# Patient Record
Sex: Male | Born: 1965 | Race: Black or African American | Hispanic: No | Marital: Married | Smoking: Never smoker
Health system: Southern US, Community
[De-identification: ages and names within clinical notes are randomized; demographics above are authoritative.]

---

## 1999-09-18 ENCOUNTER — Emergency Department (HOSPITAL_COMMUNITY): Admission: EM | Admit: 1999-09-18 | Discharge: 1999-09-18 | Payer: Self-pay | Admitting: Emergency Medicine

## 2001-05-13 ENCOUNTER — Encounter: Payer: Self-pay | Admitting: Emergency Medicine

## 2001-05-13 ENCOUNTER — Emergency Department (HOSPITAL_COMMUNITY): Admission: EM | Admit: 2001-05-13 | Discharge: 2001-05-13 | Payer: Self-pay | Admitting: Emergency Medicine

## 2001-05-15 ENCOUNTER — Encounter: Payer: Self-pay | Admitting: Family Medicine

## 2001-05-15 ENCOUNTER — Encounter: Admission: RE | Admit: 2001-05-15 | Discharge: 2001-05-15 | Payer: Self-pay | Admitting: Family Medicine

## 2001-10-13 ENCOUNTER — Emergency Department (HOSPITAL_COMMUNITY): Admission: EM | Admit: 2001-10-13 | Discharge: 2001-10-13 | Payer: Self-pay | Admitting: Emergency Medicine

## 2001-10-13 ENCOUNTER — Encounter: Payer: Self-pay | Admitting: Emergency Medicine

## 2003-11-15 ENCOUNTER — Emergency Department (HOSPITAL_COMMUNITY): Admission: EM | Admit: 2003-11-15 | Discharge: 2003-11-15 | Payer: Self-pay | Admitting: Emergency Medicine

## 2003-11-23 ENCOUNTER — Encounter: Admission: RE | Admit: 2003-11-23 | Discharge: 2003-11-23 | Payer: Self-pay | Admitting: Orthopedic Surgery

## 2003-12-09 ENCOUNTER — Encounter: Admission: RE | Admit: 2003-12-09 | Discharge: 2004-03-08 | Payer: Self-pay | Admitting: Orthopedic Surgery

## 2008-05-28 ENCOUNTER — Emergency Department (HOSPITAL_COMMUNITY): Admission: EM | Admit: 2008-05-28 | Discharge: 2008-05-28 | Payer: Self-pay | Admitting: Emergency Medicine

## 2011-03-17 ENCOUNTER — Emergency Department (HOSPITAL_COMMUNITY): Payer: No Typology Code available for payment source

## 2011-03-17 ENCOUNTER — Emergency Department (HOSPITAL_COMMUNITY)
Admission: EM | Admit: 2011-03-17 | Discharge: 2011-03-17 | Disposition: A | Discharge status: Home / Self Care | Payer: No Typology Code available for payment source | Attending: Emergency Medicine | Admitting: Emergency Medicine

## 2011-03-17 DIAGNOSIS — M25529 Pain in unspecified elbow: Secondary | ICD-10-CM | POA: Insufficient documentation

## 2011-03-17 DIAGNOSIS — M542 Cervicalgia: Secondary | ICD-10-CM | POA: Insufficient documentation

## 2011-03-17 DIAGNOSIS — S139XXA Sprain of joints and ligaments of unspecified parts of neck, initial encounter: Secondary | ICD-10-CM | POA: Insufficient documentation

## 2011-05-09 LAB — GLUCOSE, CAPILLARY: Glucose-Capillary: 98

## 2012-06-22 ENCOUNTER — Encounter (HOSPITAL_COMMUNITY): Payer: Self-pay | Admitting: Emergency Medicine

## 2012-06-22 ENCOUNTER — Emergency Department (HOSPITAL_COMMUNITY)
Admission: EM | Admit: 2012-06-22 | Discharge: 2012-06-22 | Disposition: A | Discharge status: Home / Self Care | Payer: BC Managed Care – PPO | Attending: Emergency Medicine | Admitting: Emergency Medicine

## 2012-06-22 DIAGNOSIS — K2970 Gastritis, unspecified, without bleeding: Secondary | ICD-10-CM | POA: Insufficient documentation

## 2012-06-22 LAB — URINALYSIS, MICROSCOPIC ONLY
Leukocytes, UA: NEGATIVE
Protein, ur: 30 mg/dL — AB
Urobilinogen, UA: 0.2 mg/dL (ref 0.0–1.0)

## 2012-06-22 LAB — CBC WITH DIFFERENTIAL/PLATELET
Eosinophils Relative: 2 % (ref 0–5)
HCT: 40.8 % (ref 39.0–52.0)
Lymphocytes Relative: 30 % (ref 12–46)
Lymphs Abs: 2.1 10*3/uL (ref 0.7–4.0)
MCV: 101 fL — ABNORMAL HIGH (ref 78.0–100.0)
Monocytes Absolute: 0.6 10*3/uL (ref 0.1–1.0)
RBC: 4.04 MIL/uL — ABNORMAL LOW (ref 4.22–5.81)
RDW: 12.6 % (ref 11.5–15.5)
WBC: 7.1 10*3/uL (ref 4.0–10.5)

## 2012-06-22 LAB — COMPREHENSIVE METABOLIC PANEL
BUN: 9 mg/dL (ref 6–23)
CO2: 26 mEq/L (ref 19–32)
Calcium: 9.2 mg/dL (ref 8.4–10.5)
Creatinine, Ser: 1.02 mg/dL (ref 0.50–1.35)
GFR calc Af Amer: >90 mL/min (ref >90)
GFR calc non Af Amer: 86 mL/min — ABNORMAL LOW (ref >90)
Glucose, Bld: 82 mg/dL (ref 70–99)

## 2012-06-22 MED ORDER — SUCRALFATE 1 G PO TABS: 1.0000 g | ORAL_TABLET | Freq: Four times a day (QID) | ORAL | Status: DC

## 2012-06-22 MED ORDER — GI COCKTAIL ~~LOC~~
30.0000 mL | Freq: Once | ORAL | Status: AC
  Administered 2012-06-22: 30 mL via ORAL
  Filled 2012-06-22: qty 30

## 2012-06-22 MED ORDER — FAMOTIDINE 20 MG PO TABS
20.0000 mg | ORAL_TABLET | Freq: Once | ORAL | Status: AC
  Administered 2012-06-22: 20 mg via ORAL
  Filled 2012-06-22: qty 1

## 2012-06-22 MED ORDER — OMEPRAZOLE 20 MG PO CPDR: 20.0000 mg | DELAYED_RELEASE_CAPSULE | Freq: Every day | ORAL | Status: DC

## 2012-06-22 MED ORDER — HYDROCODONE-ACETAMINOPHEN 5-325 MG PO TABS: 1.0000 | ORAL_TABLET | ORAL | Status: DC | PRN

## 2012-06-22 MED ORDER — PANTOPRAZOLE SODIUM 40 MG PO TBEC
40.0000 mg | DELAYED_RELEASE_TABLET | Freq: Once | ORAL | Status: AC
  Administered 2012-06-22: 40 mg via ORAL
  Filled 2012-06-22: qty 1

## 2012-06-22 NOTE — ED Notes (Signed)
Notified RN that pt was moved to room from triage

## 2012-06-22 NOTE — ED Provider Notes (Signed)
History     CSN: [id number]  Arrival date & time 06/22/12  1814   First MD Initiated Contact with Patient 06/22/12 2155      Chief Complaint  Patient presents with  . Abdominal Pain    (Consider location/radiation/quality/duration/timing/severity/associated sxs/prior treatment) HPI History provided by pt.   Pt c/o intermittent epigastric pain for the past 2 weeks.  Pain has been constant recently.  Radiates into mid-line chest.  No associated fever, cough, SOB, N/V/D, hematemesis/hematochezia/melena, urinary sx.  No aggravating/alleviating factors, including position, exertion and eating.  Pt drinks alcohol on a daily basis, heavily on weekends.  He is otherwise healthy.  No h/o GERD, gastritis, PUD, pancreatitis.  No h/o abdominal surgeries.  No FH early MI.   History reviewed. No pertinent past medical history.  History reviewed. No pertinent past surgical history.  History reviewed. No pertinent family history.  History  Substance Use Topics  . Smoking status: Never Smoker   . Smokeless tobacco: Not on file  . Alcohol Use: 7.2 oz/week    12 Cans of beer per week      Review of Systems  All other systems reviewed and are negative.    Allergies  Review of patient's allergies indicates no known allergies.  Home Medications   Current Outpatient Rx  Name  Route  Sig  Dispense  Refill  . RANITIDINE HCL 150 MG PO TABS   Oral   Take 150 mg by mouth once.           BP 131/76  Pulse 63  Temp 97.9 F (36.6 C) (Oral)  Resp 16  SpO2 96%  Physical Exam  Nursing note and vitals reviewed. Constitutional: He is oriented to person, place, and time. He appears well-developed and well-nourished. No distress.  HENT:  Head: Normocephalic and atraumatic.  Eyes:       Normal appearance  Neck: Normal range of motion.  Cardiovascular: Normal rate and regular rhythm.   Pulmonary/Chest: Effort normal and breath sounds normal. No respiratory distress.  Abdominal: Soft.  Bowel sounds are normal. He exhibits no distension and no mass. There is no rebound and no guarding.       Epigastric, LUQ and mild LLQ ttp.    Genitourinary:       No CVA tenderness  Musculoskeletal: Normal range of motion.  Neurological: He is alert and oriented to person, place, and time.  Skin: Skin is warm and dry. No rash noted.  Psychiatric: He has a normal mood and affect. His behavior is normal.    ED Course  Procedures (including critical care time)   Date: 06/22/2012  Rate: 59  Rhythm: normal sinus rhythm  QRS Axis: normal  Intervals: normal  ST/T Wave abnormalities: normal  Conduction Disutrbances:none  Narrative Interpretation:   Old EKG Reviewed: none available   Labs Reviewed  CBC WITH DIFFERENTIAL - Abnormal; Notable for the following:    RBC 4.04 (*)     MCV 101.0 (*)     MCH 35.1 (*)     All other components within normal limits  COMPREHENSIVE METABOLIC PANEL - Abnormal; Notable for the following:    Alkaline Phosphatase 37 (*)     GFR calc non Af Amer 86 (*)     All other components within normal limits  URINALYSIS, MICROSCOPIC ONLY - Abnormal; Notable for the following:    Hgb urine dipstick SMALL (*)     Protein, ur 30 (*)     All other components within normal  limits  LIPASE, BLOOD   No results found.   1. Gastritis       MDM  46yo M who drinks alcohol heavily, otherwise healthy, presents w/ c/o epigastric pain.  Based on history and exam and unremarkable labs, suspect gastritis.  Low risk ACS, pain non-exertional, no associated SOB and EKG non-ischemic.  Pt to receive GI cocktail and po pepcid/protonix.  Will reassess shortly.    Pain improved.  Recommended that he cut back on alcohol, prescribed prilosec and carafate, referred to GI for persistent/recurrent sx and Return precautions discussed. 11:30 PM     John Kerr, [place] 06/22/12 2330

## 2012-06-22 NOTE — ED Notes (Signed)
Pt states that he has had generalized abd pain x 2 wks.  Denies NVD.  Pain 8/10.

## 2012-06-22 NOTE — ED Provider Notes (Signed)
Medical screening examination/treatment/procedure(s) were performed by non-physician practitioner and as supervising physician I was immediately available for consultation/collaboration.   Gerhard Munch, MD 06/22/12 [phone]

## 2013-08-29 ENCOUNTER — Encounter: Payer: Self-pay | Admitting: Internal Medicine

## 2013-09-24 ENCOUNTER — Ambulatory Visit: Payer: BC Managed Care – PPO | Admitting: Internal Medicine

## 2013-10-02 ENCOUNTER — Emergency Department (HOSPITAL_COMMUNITY): Payer: BC Managed Care – PPO

## 2013-10-02 ENCOUNTER — Emergency Department (INDEPENDENT_AMBULATORY_CARE_PROVIDER_SITE_OTHER)
Admission: EM | Admit: 2013-10-02 | Discharge: 2013-10-02 | Disposition: A | Discharge status: Other Acute Inpatient Hospital | Payer: BC Managed Care – PPO | Source: Home / Self Care | Attending: Emergency Medicine | Admitting: Emergency Medicine

## 2013-10-02 ENCOUNTER — Emergency Department (HOSPITAL_COMMUNITY)
Admission: EM | Admit: 2013-10-02 | Discharge: 2013-10-02 | Disposition: A | Discharge status: Home / Self Care | Payer: BC Managed Care – PPO | Attending: Emergency Medicine | Admitting: Emergency Medicine

## 2013-10-02 ENCOUNTER — Encounter (HOSPITAL_COMMUNITY): Payer: Self-pay | Admitting: Emergency Medicine

## 2013-10-02 DIAGNOSIS — R109 Unspecified abdominal pain: Secondary | ICD-10-CM

## 2013-10-02 DIAGNOSIS — Z79899 Other long term (current) drug therapy: Secondary | ICD-10-CM | POA: Insufficient documentation

## 2013-10-02 DIAGNOSIS — R1011 Right upper quadrant pain: Secondary | ICD-10-CM | POA: Insufficient documentation

## 2013-10-02 DIAGNOSIS — R1013 Epigastric pain: Secondary | ICD-10-CM | POA: Insufficient documentation

## 2013-10-02 LAB — COMPREHENSIVE METABOLIC PANEL
ALK PHOS: 38 U/L — AB (ref 39–117)
ALT: 14 U/L (ref 0–53)
AST: 15 U/L (ref 0–37)
Albumin: 3.7 g/dL (ref 3.5–5.2)
BUN: 16 mg/dL (ref 6–23)
CO2: 27 meq/L (ref 19–32)
Calcium: 9 mg/dL (ref 8.4–10.5)
Chloride: 105 mEq/L (ref 96–112)
Creatinine, Ser: 0.99 mg/dL (ref 0.50–1.35)
GLUCOSE: 98 mg/dL (ref 70–99)
POTASSIUM: 4.2 meq/L (ref 3.7–5.3)
SODIUM: 142 meq/L (ref 137–147)
Total Bilirubin: 0.3 mg/dL (ref 0.3–1.2)
Total Protein: 6.7 g/dL (ref 6.0–8.3)

## 2013-10-02 LAB — CBC WITH DIFFERENTIAL/PLATELET
Basophils Absolute: 0 10*3/uL (ref 0.0–0.1)
Basophils Relative: 0 % (ref 0–1)
EOS PCT: 2 % (ref 0–5)
Eosinophils Absolute: 0.1 10*3/uL (ref 0.0–0.7)
HCT: 40 % (ref 39.0–52.0)
HEMOGLOBIN: 14.2 g/dL (ref 13.0–17.0)
LYMPHS ABS: 1.9 10*3/uL (ref 0.7–4.0)
LYMPHS PCT: 23 % (ref 12–46)
MCH: 35.9 pg — AB (ref 26.0–34.0)
MCHC: 35.5 g/dL (ref 30.0–36.0)
MCV: 101 fL — AB (ref 78.0–100.0)
MONOS PCT: 7 % (ref 3–12)
Monocytes Absolute: 0.6 10*3/uL (ref 0.1–1.0)
NEUTROS PCT: 68 % (ref 43–77)
Neutro Abs: 5.6 10*3/uL (ref 1.7–7.7)
PLATELETS: 214 10*3/uL (ref 150–400)
RBC: 3.96 MIL/uL — AB (ref 4.22–5.81)
RDW: 12.9 % (ref 11.5–15.5)
WBC: 8.3 10*3/uL (ref 4.0–10.5)

## 2013-10-02 LAB — LIPASE, BLOOD: LIPASE: 18 U/L (ref 11–59)

## 2013-10-02 MED ORDER — HYDROMORPHONE HCL PF 1 MG/ML IJ SOLN
2.0000 mg | Freq: Once | INTRAMUSCULAR | Status: AC
  Administered 2013-10-02: 2 mg via INTRAMUSCULAR

## 2013-10-02 MED ORDER — HYDROMORPHONE HCL PF 1 MG/ML IJ SOLN
INTRAMUSCULAR | Status: AC
  Filled 2013-10-02: qty 2

## 2013-10-02 MED ORDER — SODIUM CHLORIDE 0.9 % IV BOLUS (SEPSIS)
1000.0000 mL | Freq: Once | INTRAVENOUS | Status: AC
  Administered 2013-10-02: 1000 mL via INTRAVENOUS

## 2013-10-02 MED ORDER — IOHEXOL 300 MG/ML  SOLN
100.0000 mL | Freq: Once | INTRAMUSCULAR | Status: AC | PRN
  Administered 2013-10-02: 100 mL via INTRAVENOUS

## 2013-10-02 MED ORDER — SODIUM CHLORIDE 0.9 % IV SOLN
Freq: Once | INTRAVENOUS | Status: AC
  Administered 2013-10-02: 16:00:00 via INTRAVENOUS

## 2013-10-02 MED ORDER — HYDROMORPHONE HCL PF 1 MG/ML IJ SOLN
1.0000 mg | Freq: Once | INTRAMUSCULAR | Status: AC
  Administered 2013-10-02: 1 mg via INTRAVENOUS
  Filled 2013-10-02: qty 1

## 2013-10-02 MED ORDER — IOHEXOL 300 MG/ML  SOLN
25.0000 mL | INTRAMUSCULAR | Status: AC
  Administered 2013-10-02: 25 mL via ORAL

## 2013-10-02 MED ORDER — ONDANSETRON HCL 4 MG/2ML IJ SOLN
4.0000 mg | Freq: Once | INTRAMUSCULAR | Status: AC
  Administered 2013-10-02: 4 mg via INTRAMUSCULAR

## 2013-10-02 MED ORDER — OXYCODONE-ACETAMINOPHEN 5-325 MG PO TABS: 1.0000 | ORAL_TABLET | ORAL | Status: DC | PRN

## 2013-10-02 MED ORDER — ONDANSETRON HCL 4 MG/2ML IJ SOLN
INTRAMUSCULAR | Status: AC
  Filled 2013-10-02: qty 2

## 2013-10-02 MED ORDER — PANTOPRAZOLE SODIUM 20 MG PO TBEC: 20.0000 mg | DELAYED_RELEASE_TABLET | Freq: Every day | ORAL | Status: DC

## 2013-10-02 MED ORDER — ONDANSETRON HCL 4 MG/2ML IJ SOLN
4.0000 mg | Freq: Once | INTRAMUSCULAR | Status: AC
  Administered 2013-10-02: 4 mg via INTRAVENOUS
  Filled 2013-10-02: qty 2

## 2013-10-02 MED ORDER — SODIUM CHLORIDE 0.9 % IV SOLN
INTRAVENOUS | Status: DC
  Administered 2013-10-02: 15:00:00 via INTRAVENOUS

## 2013-10-02 NOTE — ED Notes (Signed)
PA at bedside.

## 2013-10-02 NOTE — ED Notes (Signed)
CT notified pt finished contrast  

## 2013-10-02 NOTE — Discharge Instructions (Signed)
Abdominal Pain, Adult °Many things can cause abdominal pain. Usually, abdominal pain is not caused by a disease and will improve without treatment. It can often be observed and treated at home. Your health care provider will do a physical exam and possibly order blood tests and X-rays to help determine the seriousness of your pain. However, in many cases, more time must pass before a clear cause of the pain can be found. Before that point, your health care provider may not know if you need more testing or further treatment. °HOME CARE INSTRUCTIONS  °Monitor your abdominal pain for any changes. The following actions may help to alleviate any discomfort you are experiencing: °· Only take over-the-counter or prescription medicines as directed by your health care provider. °· Do not take laxatives unless directed to do so by your health care provider. °· Try a clear liquid diet (broth, tea, or water) as directed by your health care provider. Slowly move to a bland diet as tolerated. °SEEK MEDICAL CARE IF: °· You have unexplained abdominal pain. °· You have abdominal pain associated with nausea or diarrhea. °· You have pain when you urinate or have a bowel movement. °· You experience abdominal pain that wakes you in the night. °· You have abdominal pain that is worsened or improved by eating food. °· You have abdominal pain that is worsened with eating fatty foods. °SEEK IMMEDIATE MEDICAL CARE IF:  °· Your pain does not go away within 2 hours. °· You have a fever. °· You keep throwing up (vomiting). °· Your pain is felt only in portions of the abdomen, such as the right side or the left lower portion of the abdomen. °· You pass bloody or black tarry stools. °MAKE SURE YOU: °· Understand these instructions.   °· Will watch your condition.   °· Will get help right away if you are not doing well or get worse.   °Document Released: 05/03/2005 Document Revised: 05/14/2013 Document Reviewed: 04/02/2013 °[employer]® Patient  Information ©2014 [employer]. ° °

## 2013-10-02 NOTE — ED Provider Notes (Addendum)
  Chief Complaint   Chief Complaint  Patient presents with  . Abdominal Pain    History of Present Illness   John Kerr is a 48 year old male who has had a one-month history of recurring right upper quadrant pain without radiation. The pain has been worse the past 24 hours and is now rated as a 10 over 10 in intensity. It's worse when his stomach is empty. Is not worse with eating or food. He denies any associated symptoms of fever, chills, nausea, anorexia, or vomiting. He's had no constipation, diarrhea, melena, or urinary tract infection and he has had no known history of ulcer disease or gallstones. He does not use alcohol or tobacco. He does smoke marijuana.  Review of Systems   Other than as noted above, the patient denies any of the following symptoms: Constitutional:  No fever, chills, weight loss or anorexia. Lungs:  No cough or shortness of breath. Heart:  No chest pain, palpitations, syncope or edema.  Abdomen:  No nausea, vomiting, hematememesis, melena, diarrhea, or hematochezia. GU:  No dysuria, frequency, urgency, or hematuria.  No testicular pain or swelling.  PMFSH   Past medical history, family history, social history, meds, and allergies were reviewed.   Physical Examination     Vital signs:  BP 140/88  Pulse 100  Temp(Src) 98.6 F (37 C) (Oral)  Resp 16  SpO2 99% Gen:  Alert, oriented, in no distress and tearful secondary to abdominal pain. Lungs:  Breath sounds clear and equal bilaterally.  No wheezes, rales or rhonchi. Heart:  Regular rhythm.  No gallops or murmers.   Abdomen:  Flat but rigid to palpation. There is severe tenderness to palpation in the right upper quadrant with guarding and rebound. Murphy sign and Murphy's punch are positive. No mass. Bowel sounds normal. Skin:  Clear, warm and dry.  No rash.   Course in Urgent Care Center   He was given Dilaudid 2 mg IM and Zofran 4 mg IM and started on IV of normal saline at 50 mL per  hour.  Assessment   The encounter diagnosis was RUQ pain.  Differential diagnosis is ulcer disease versus cholecystitis. Will need further workup including ultrasound or CT or both.  Plan     The patient was transferred to the ED via [facility] in stable condition.  Medical Decision Making   48 year old male with a 1 month history of intermittent RUQ pain, worse in past 24 hours.  No fever, nausea, vomiting, diarrhea, or melena.  No history of ulcer or gallstone.  Denies alcohol or tobacco.  Smokes marijuana.  On exam, his abdomen is rigid and tender in RUQ with guarding and positive Murphy's sign and punch.  We have given Dilaudid 2 mg IM and Zofran 4 mg IM and will start an IV and transport via [facility].  Differential diagnosis:  Ulcer, cholecystitis.      Reuben Likesavid C Charma Mocarski, MD 10/02/13 1501  Reuben Likesavid C Marlies Ligman, MD 10/02/13 [phone]

## 2013-10-02 NOTE — ED Notes (Signed)
Per Melvenia BeamShari, [place], to make pain and nausea medication PRN. PT instructed to notify this nurse if needing pain medication.

## 2013-10-02 NOTE — ED Notes (Signed)
Pt  Reports       Abdominal  Pain    Mid- r  Side    Off and   On  X  1  Month  Worse  Over  Last  24  Hours          denys  Any  vomitng  Or  Diarrhea

## 2013-10-02 NOTE — ED Notes (Signed)
Per ems, pt has RUQ x1 month 10/10 pain. Pt given 1mg  dilaudid and 4 zofran by Urgent Care, pain decreased to 7/10. Pt denies hx, meds, or allergies. 66HR, 18 RR, 98% RA, 139/89 BP. Denies N/V/D, no fever, no CP. PT AAOx4.

## 2013-10-02 NOTE — ED Notes (Signed)
Pt returned from CT °

## 2013-10-02 NOTE — ED Provider Notes (Signed)
CSN: [id number]     Arrival date & time 10/02/13  1536 History   First MD Initiated Contact with Patient 10/02/13 1536     Chief Complaint  Patient presents with  . Abdominal Pain     (Consider location/radiation/quality/duration/timing/severity/associated sxs/prior Treatment) Patient is a 48 y.o. male presenting with abdominal pain. The history is provided by the patient. No language interpreter was used.  Abdominal Pain Pain location:  Epigastric and RUQ Pain quality: aching   Pain radiates to:  Does not radiate Associated symptoms: no chills, no constipation, no diarrhea, no fever, no nausea, no shortness of breath and no vomiting   Associated symptoms comment:  RUQ and epigastric pain for the past one month that has been intermittent until the last two or three days when it became constant. No N, V, fever or change in bowel habits. No alleviating or aggravating factors including food or position.    History reviewed. No pertinent past medical history. History reviewed. No pertinent past surgical history. No family history on file. History  Substance Use Topics  . Smoking status: Never Smoker   . Smokeless tobacco: Not on file  . Alcohol Use: 7.2 oz/week    12 Cans of beer per week    Review of Systems  Constitutional: Negative for fever and chills.  Respiratory: Negative.  Negative for shortness of breath.   Cardiovascular: Negative.   Gastrointestinal: Positive for abdominal pain. Negative for nausea, vomiting, diarrhea, constipation and blood in stool.  Musculoskeletal: Negative.   Skin: Negative.   Neurological: Negative.       Allergies  Review of patient's allergies indicates no known allergies.  Home Medications   Current Outpatient Rx  Name  Route  Sig  Dispense  Refill  . calcium carbonate (TUMS - DOSED IN MG ELEMENTAL CALCIUM) 500 MG chewable tablet   Oral   Chew 2 tablets by mouth daily as needed for indigestion or heartburn.         . ENSURE PLUS  (ENSURE PLUS) LIQD   Oral   Take 237 mLs by mouth 2 (two) times daily between meals.          BP 108/67  Pulse 55  Temp(Src) 97.7 F (36.5 C) (Oral)  Resp 18  Ht 5\' 6"  (1.676 m)  Wt 142 lb (64.411 kg)  BMI 22.93 kg/m2  SpO2 97% Physical Exam  Constitutional: He is oriented to person, place, and time. He appears well-developed and well-nourished.  HENT:  Head: Normocephalic.  Neck: Normal range of motion. Neck supple.  Cardiovascular: Normal rate and regular rhythm.   Pulmonary/Chest: Effort normal and breath sounds normal.  Abdominal: Soft. Bowel sounds are normal. There is tenderness. There is no rebound and no guarding.  RUQ and epigastric tenderness that is mild. Mild guarding. No rigidity. BS active.  Musculoskeletal: Normal range of motion.  Neurological: He is alert and oriented to person, place, and time.  Skin: Skin is warm and dry. No rash noted.  Psychiatric: He has a normal mood and affect.    ED Course  Procedures (including critical care time) Labs Review Labs Reviewed  CBC WITH DIFFERENTIAL - Abnormal; Notable for the following:    RBC 3.96 (*)    MCV 101.0 (*)    MCH 35.9 (*)    All other components within normal limits  COMPREHENSIVE METABOLIC PANEL - Abnormal; Notable for the following:    Alkaline Phosphatase 38 (*)    All other components within normal limits  LIPASE,  BLOOD   Imaging Review Ct Abdomen Pelvis W Contrast  10/02/2013   CLINICAL DATA:  Right upper quadrant pain for 2 months.  EXAM: CT ABDOMEN AND PELVIS WITH CONTRAST  TECHNIQUE: Multidetector CT imaging of the abdomen and pelvis was performed using the standard protocol following bolus administration of intravenous contrast.  CONTRAST:  100 mL OMNIPAQUE IOHEXOL 300 MG/ML  SOLN  COMPARISON:  None.  FINDINGS: The lung bases are clear.  No pleural or pericardial effusion.  A 0.6 cm in diameter hypo attenuating lesion in the right hepatic lobe on image 23 is most consistent with a cyst. The  liver is otherwise unremarkable. The gallbladder, adrenal glands, spleen, pancreas and kidneys appear normal. The stomach and small and large bowel appear normal. Structure thought to represent the appendix is seen on images 50-54 and appears normal. There is no lymphadenopathy or fluid. No focal bony abnormality is identified.  IMPRESSION: Negative CT abdomen and pelvis. No finding to explain the patient's symptoms.   Electronically Signed   By: Drusilla Kanner M.D.   On: 10/02/2013 17:46    EKG Interpretation   None       MDM   Final diagnoses:  None    1. Abdominal pain  RUQ and epigastric pain that has no other symptoms of gall bladder (N, V, association with eating, or fever). Normal lab studies, specifically, no elevated liver enzymes or lipase. CT done for further evaluation that is also negative. He has remained NAD. Ultrasound felt unnecessary given presentation. Will give PPI and PCP referral for further outpatient evaluation if symptoms persist.     Arnoldo Hooker, PA-C 10/02/13 1848

## 2013-10-02 NOTE — Discharge Instructions (Signed)
We have determined that your problem requires further evaluation in the emergency department.  We will take care of your transport there.  Once at the emergency department, you will be evaluated by a provider and they will order whatever treatment or tests they deem necessary.  We cannot guarantee that they will do any specific test or do any specific treatment.  ° °

## 2013-10-07 NOTE — ED Provider Notes (Signed)
Medical screening examination/treatment/procedure(s) were performed by non-physician practitioner and as supervising physician I was immediately available for consultation/collaboration.     Kilynn Fitzsimmons, MD 10/07/13 0732 

## 2013-10-28 ENCOUNTER — Ambulatory Visit (INDEPENDENT_AMBULATORY_CARE_PROVIDER_SITE_OTHER): Payer: BC Managed Care – PPO | Admitting: Internal Medicine

## 2013-10-28 ENCOUNTER — Encounter: Payer: Self-pay | Admitting: Internal Medicine

## 2013-10-28 VITALS — BP 124/80 | HR 88 | Ht 67.0 in | Wt 130.0 lb

## 2013-10-28 DIAGNOSIS — R1012 Left upper quadrant pain: Secondary | ICD-10-CM

## 2013-10-28 DIAGNOSIS — R1011 Right upper quadrant pain: Secondary | ICD-10-CM

## 2013-10-28 NOTE — Patient Instructions (Signed)
Please call Dr. Marina GoodellPerry if you continue to have problems.  We will put a reminder in the computer to contact you in 2 years for a colonoscopy

## 2013-10-28 NOTE — Progress Notes (Signed)
HISTORY OF PRESENT ILLNESS:  John Kerr is a 48 y.o. male  With no significant past medical history who presents today regarding problems with recurrent abdominal pain. The patient is accompanied by his wife. He reports being in his usual state of good health until about 2 months ago when he developed problems with left upper quadrant greater than right upper quadrant pain. The discomfort was described as pressure-like sensation that was constant. Despite this, he was able to work 2 jobs and sleep. The discomfort was exacerbated by bending or turning movements. The discomfort was not affected by meals nor was associated with nausea, vomiting, or abnormal bowel habits. He stopped drinking alcohol and as it was suggested that this may be the culprit. He did well for proximally one month and then the pain recurred. The pain was said to last for continuous month until Monday when he described it as quite severe leading to a visit to the emergency room. Review of those records finds unremarkable CBC with differential, comprehensive metabolic panel, and lipase. He also underwent contrast-enhanced CT scan of the abdomen and pelvis 10/02/2013. Examination was normal. He was given premedication and sent home with take 20 day supply of omeprazole 40 mg which he has taken once daily until today. Upon same day discharged from the emergency room, his pain was gone. Has not returned. He denies a history of reflux symptoms or NSAID use. He remains abstinent from alcohol for 6 months now. No complaints.  REVIEW OF SYSTEMS:  All non-GI ROS negative except for sinus and allergy trouble, night sweats,  History reviewed. No pertinent past medical history.  History reviewed. No pertinent past surgical history.  Social History John ColletFrederick Farrey  reports that he has never smoked. He has never used smokeless tobacco. He reports that he drinks about 7.2 ounces of alcohol per week. He reports that he uses illicit drugs  (Marijuana).  family history is not on file.  No Known Allergies     PHYSICAL EXAMINATION: Vital signs: BP 124/80  Pulse 88  Ht 5\' 7"  (1.702 m)  Wt 130 lb (58.968 kg)  BMI 20.36 kg/m2  Constitutional: generally well-appearing, no acute distress Psychiatric: alert and oriented x3, cooperative Eyes: extraocular movements intact, anicteric, conjunctiva pink Mouth: oral pharynx moist, no lesions Neck: supple no lymphadenopathy Cardiovascular: heart regular rate and rhythm, no murmur Lungs: clear to auscultation bilaterally Abdomen: soft, nontender, nondistended, no obvious ascites, no peritoneal signs, normal bowel sounds, no organomegaly Rectal: Omitted Extremities: no lower extremity edema bilaterally Skin: no lesions on visible extremities Neuro: No focal deficits. No asterixis.    ASSESSMENT:  #1. Recurrent problems with upper abdominal discomfort as described. Most consistent with musculoskeletal. Negative extensive workup as outlined. Has been asymptomatic now for 3 weeks #2. Colon cancer screening. Baseline risk  PLAN:  #1. Expectant management at this point. If pain or to return, other considerations would be upper endoscopy and/or ultrasound. He knows contact the office for questions or problems #2. Assuming he continues to do well, screening colonoscopy at age 48. Recall placed in the computer system

## 2015-05-31 ENCOUNTER — Encounter (HOSPITAL_COMMUNITY): Payer: Self-pay | Admitting: Emergency Medicine

## 2015-05-31 ENCOUNTER — Emergency Department (INDEPENDENT_AMBULATORY_CARE_PROVIDER_SITE_OTHER)
Admission: EM | Admit: 2015-05-31 | Discharge: 2015-05-31 | Disposition: A | Discharge status: Home / Self Care | Payer: BC Managed Care – PPO | Source: Home / Self Care | Attending: Family Medicine | Admitting: Family Medicine

## 2015-05-31 DIAGNOSIS — M67432 Ganglion, left wrist: Secondary | ICD-10-CM | POA: Diagnosis not present

## 2015-05-31 MED ORDER — NAPROXEN 375 MG PO TABS
375.0000 mg | ORAL_TABLET | Freq: Two times a day (BID) | ORAL | Status: AC
Start: 2015-05-31 — End: ?

## 2015-05-31 NOTE — ED Notes (Signed)
Pt was lifting something about six months ago when his left wrist gave out.  Three days later he had a knot form on the lateral inside of his wrist.  It is painful to touch and he states it gets bigger when he is working.  He denies any actual injury to the wrist.

## 2015-05-31 NOTE — ED Provider Notes (Signed)
CSN: [id number]     Arrival date & time 05/31/15  1544 History   First MD Initiated Contact with Patient 05/31/15 1753     Chief Complaint  Patient presents with  . Wrist Pain   (Consider location/radiation/quality/duration/timing/severity/associated sxs/prior Treatment) Patient is a 49 y.o. male presenting with wrist pain. The history is provided by the patient.  Wrist Pain This is a new problem. The current episode started more than 2 days ago. The problem occurs constantly. The problem has not changed since onset.   History reviewed. No pertinent past medical history. History reviewed. No pertinent past surgical history. History reviewed. No pertinent family history. Social History  Substance Use Topics  . Smoking status: Never Smoker   . Smokeless tobacco: Never Used  . Alcohol Use: No    Review of Systems  Constitutional: Negative.   HENT: Negative.   Eyes: Negative.   Respiratory: Negative.   Cardiovascular: Negative.   Gastrointestinal: Negative.   Endocrine: Negative.   Genitourinary: Negative.   Musculoskeletal:       Cyst on left wrist  Allergic/Immunologic: Negative.   Neurological: Negative.   Hematological: Negative.   Psychiatric/Behavioral: Negative.     Allergies  Review of patient's allergies indicates no known allergies.  Home Medications   Prior to Admission medications   Medication Sig Start Date End Date Taking? Authorizing Provider  naproxen (NAPROSYN) 375 MG tablet Take 1 tablet (375 mg total) by mouth 2 (two) times daily. 05/31/15   Deatra CanterWilliam J Magalie Almon, FNP   Meds Ordered and Administered this Visit  Medications - No data to display  BP 108/75 mmHg  Pulse 58  Temp(Src) 98 F (36.7 C) (Oral)  Resp 16  SpO2 97% No data found.   Physical Exam  Constitutional: He is oriented to person, place, and time. He appears well-developed and well-nourished.  HENT:  Head: Normocephalic and atraumatic.  Eyes: Conjunctivae and EOM are normal.  Pupils are equal, round, and reactive to light.  Cardiovascular: Normal rate, regular rhythm and normal heart sounds.   Pulmonary/Chest: Effort normal and breath sounds normal.  Abdominal: Soft. Bowel sounds are normal.  Musculoskeletal:  Left ventral wrist proximal radius with large ganglion cyst  Neurological: He is alert and oriented to person, place, and time.  Skin: Skin is warm and dry.    ED Course  Procedures (including critical care time)  Labs Review Labs Reviewed - No data to display  Imaging Review No results found.   Visual Acuity Review  Right Eye Distance:   Left Eye Distance:   Bilateral Distance:    Right Eye Near:   Left Eye Near:    Bilateral Near:         MDM   1. Ganglion cyst of wrist, left    Refer to Hand Ortho Dr. Leatha Gildingrtmann    Jacquelina Hewins J Parke Jandreau, FNP 05/31/15 1816  Deatra CanterWilliam J Keyleen Cerrato, FNP 05/31/15 [phone]

## 2015-05-31 NOTE — Discharge Instructions (Signed)
Ganglion Cyst  A ganglion cyst is a noncancerous, fluid-filled lump that occurs near joints or tendons. The ganglion cyst grows out of a joint or the lining of a tendon. It most often develops in the hand or wrist, but it can also develop in the shoulder, elbow, hip, knee, ankle, or foot. The round or oval ganglion cyst can be the size of a pea or larger than a grape. Increased activity may enlarge the size of the cyst because more fluid starts to build up.   CAUSES  It is not known what causes a ganglion cyst to grow. However, it may be related to:  · Inflammation or irritation around the joint.  · An injury.  · Repetitive movements or overuse.  · Arthritis.  RISK FACTORS  Risk factors include:  · Being a woman.  · Being age 20-50.  SIGNS AND SYMPTOMS  Symptoms may include:   · A lump. This most often appears on the hand or wrist, but it can occur in other areas of the body.  · Tingling.  · Pain.  · Numbness.  · Muscle weakness.  · Weak grip.  · Less movement in a joint.  DIAGNOSIS  Ganglion cysts are most often diagnosed based on a physical exam. Your health care provider will feel the lump and may shine a light alongside it. If it is a ganglion cyst, a light often shines through it. Your health care provider may order an X-ray, ultrasound, or MRI to rule out other conditions.  TREATMENT  Ganglion cysts usually go away on their own without treatment. If pain or other symptoms are involved, treatment may be needed. Treatment is also needed if the ganglion cyst limits your movement or if it gets infected. Treatment may include:  · Wearing a brace or splint on your wrist or finger.  · Taking anti-inflammatory medicine.  · Draining fluid from the lump with a needle (aspiration).  · Injecting a steroid into the joint.  · Surgery to remove the ganglion cyst.  HOME CARE INSTRUCTIONS  · Do not press on the ganglion cyst, poke it with a needle, or hit it.  · Take medicines only as directed by your health care  provider.  · Wear your brace or splint as directed by your health care provider.  · Watch your ganglion cyst for any changes.  · Keep all follow-up visits as directed by your health care provider. This is important.  SEEK MEDICAL CARE IF:  · Your ganglion cyst becomes larger or more painful.  · You have increased redness, red streaks, or swelling.  · You have pus coming from the lump.  · You have weakness or numbness in the affected area.  · You have a fever or chills.     This information is not intended to replace advice given to you by your health care provider. Make sure you discuss any questions you have with your health care provider.     Document Released: 07/21/2000 Document Revised: 08/14/2014 Document Reviewed: 01/06/2014  Elsevier Interactive Patient Education ©2016 [employer]

## 2015-11-11 ENCOUNTER — Encounter: Payer: Self-pay | Admitting: Internal Medicine

## 2019-10-02 ENCOUNTER — Ambulatory Visit: Payer: BC Managed Care – PPO | Attending: Family

## 2019-10-02 DIAGNOSIS — Z23 Encounter for immunization: Secondary | ICD-10-CM

## 2019-10-02 NOTE — Progress Notes (Signed)
   Covid-19 Vaccination Clinic  Name:  Carston Riedl    MRN: [id number] DOB: 1965/10/06  10/02/2019  Mr. Speyer was observed post Covid-19 immunization for 15 minutes without incidence. He was provided with Vaccine Information Sheet and instruction to access the V-Safe system.   Mr. Bannister was instructed to call 911 with any severe reactions post vaccine: Marland Kitchen Difficulty breathing  . Swelling of your face and throat  . A fast heartbeat  . A bad rash all over your body  . Dizziness and weakness    Immunizations Administered    Name Date Dose VIS Date Route   Moderna COVID-19 Vaccine 10/02/2019  2:32 PM 0.5 mL 07/08/2019 Intramuscular   Manufacturer: Moderna   Lot: [id number]   NDC: 83254-982-64

## 2019-11-04 ENCOUNTER — Ambulatory Visit: Payer: BC Managed Care – PPO | Attending: Internal Medicine

## 2019-11-11 ENCOUNTER — Ambulatory Visit: Payer: BC Managed Care – PPO | Attending: Family

## 2019-11-11 DIAGNOSIS — Z23 Encounter for immunization: Secondary | ICD-10-CM

## 2019-11-11 NOTE — Progress Notes (Signed)
   Covid-19 Vaccination Clinic  Name:  Jerryl Holzhauer    MRN: [id number] DOB: March 08, 1966  11/11/2019  Mr. Jutte was observed post Covid-19 immunization for 15 minutes without incident. He was provided with Vaccine Information Sheet and instruction to access the V-Safe system.   Mr. Westman was instructed to call 911 with any severe reactions post vaccine: Marland Kitchen Difficulty breathing  . Swelling of face and throat  . A fast heartbeat  . A bad rash all over body  . Dizziness and weakness   Immunizations Administered    Name Date Dose VIS Date Route   Moderna COVID-19 Vaccine 11/11/2019  9:41 AM 0.5 mL 07/08/2019 Intramuscular   Manufacturer: Moderna   Lot: [id number]   NDC: 07371-062-69

## 2020-06-05 ENCOUNTER — Encounter (HOSPITAL_COMMUNITY): Payer: Self-pay

## 2020-06-05 ENCOUNTER — Ambulatory Visit (HOSPITAL_COMMUNITY)
Admission: EM | Admit: 2020-06-05 | Discharge: 2020-06-05 | Disposition: A | Discharge status: Home / Self Care | Payer: BC Managed Care – PPO | Attending: Internal Medicine | Admitting: Internal Medicine

## 2020-06-05 ENCOUNTER — Other Ambulatory Visit: Payer: Self-pay

## 2020-06-05 DIAGNOSIS — G44029 Chronic cluster headache, not intractable: Secondary | ICD-10-CM | POA: Diagnosis not present

## 2020-06-05 DIAGNOSIS — H04201 Unspecified epiphora, right lacrimal gland: Secondary | ICD-10-CM

## 2020-06-05 MED ORDER — DEXAMETHASONE SODIUM PHOSPHATE 10 MG/ML IJ SOLN
INTRAMUSCULAR | Status: AC
  Filled 2020-06-05: qty 1

## 2020-06-05 MED ORDER — KETOROLAC TROMETHAMINE 60 MG/2ML IM SOLN
60.0000 mg | Freq: Once | INTRAMUSCULAR | Status: AC
  Administered 2020-06-05: 60 mg via INTRAMUSCULAR

## 2020-06-05 MED ORDER — BACITRA-NEOMYCIN-POLYMYXIN-HC 1 % OP OINT: 1.0000 "application " | TOPICAL_OINTMENT | Freq: Three times a day (TID) | OPHTHALMIC | 0 refills | Status: AC

## 2020-06-05 MED ORDER — KETOROLAC TROMETHAMINE 60 MG/2ML IM SOLN
INTRAMUSCULAR | Status: AC
  Filled 2020-06-05: qty 2

## 2020-06-05 MED ORDER — DEXAMETHASONE SODIUM PHOSPHATE 10 MG/ML IJ SOLN
10.0000 mg | Freq: Once | INTRAMUSCULAR | Status: AC
  Administered 2020-06-05: 10 mg via INTRAMUSCULAR

## 2020-06-05 NOTE — ED Triage Notes (Signed)
Pt in with c/o headache that has been going for 2 weeks and right eye irritation that's been going on for about 1 week. States that he thinks it's pink eye because it is draining and crusted when he wakes up in the morning.   Pt has not had any medication for sxs  States that light makes the headache worse  Denies any fever, cough, congestion, n/v, neck pain

## 2020-06-05 NOTE — Discharge Instructions (Addendum)
I am hopeful the medications today help with your symptoms.  If you don't get any improvement in the next few hours I would like you to go to the ER.  If your symptoms worsen please go to the ER.  If you develop weakness, drooling, change in speech, facial weakness or drooping, or otherwise worsening please go to the ER.

## 2020-06-05 NOTE — ED Provider Notes (Signed)
[facility]    CSN: [id number] Arrival date & time: 06/05/20  1006      History   Chief Complaint Chief Complaint  Patient presents with   Headache   Conjunctivitis    HPI John Kerr is a 54 y.o. male.   John Kerr presents with complaints of headache and right eye redness and drainage.   Headache for the past two weeks. Sometimes feels off balance. States he "always" has a right sided headache for years but it is more intense than usual for him and with significant light sensitivity. Sometimes feels nausea related to pain. He states at times he can't even control his saliva due to the pain and will drool. No current drooling. His right hand will go numb. He feels numbness and tingling to tips of the fingers. Denies weakness to the hand. No right leg involvement. He sees "dots" in his vision. Sometimes blurring of vision. No head injury. He states he has had a CT of the head, last was years ago. Currently feels dizziness. Hasn't taken any medications for his headache.   Right eye tearing for the past 1.5 week. Mattering in the morning. Feels irritated. No pain to the eyeball itself.  Eye itches. Moving the right eye causes worsening of headache however. He experiences right temporal throbbing. No contacts or glasses. Sometimes he notices bulging of vein to forehead. Pain is currently 9/10 in severity to headache.     ROS per HPI, negative if not otherwise mentioned.      History reviewed. No pertinent past medical history.  There are no problems to display for this patient.   History reviewed. No pertinent surgical history.     Home Medications    Prior to Admission medications   Medication Sig Start Date End Date Taking? Authorizing Provider  bacitracin-neomycin-polymyxin-hydrocortisone (CORTISPORIN) 1 % ophthalmic ointment Place 1 application into the right eye 3 (three) times daily for 5 days. 06/05/20 06/10/20  Georgetta Haber, NP  naproxen  (NAPROSYN) 375 MG tablet Take 1 tablet (375 mg total) by mouth 2 (two) times daily. 05/31/15   Deatra Canter, FNP    Family History Family History  Problem Relation Age of Onset   Healthy Mother     Social History Social History   Tobacco Use   Smoking status: Never Smoker   Smokeless tobacco: Never Used  Substance Use Topics   Alcohol use: No   Drug use: Yes    Frequency: 3.0 times per week    Types: Marijuana     Allergies   Patient has no known allergies.   Review of Systems Review of Systems   Physical Exam Triage Vital Signs ED Triage Vitals [06/05/20 1038]  Enc Vitals Group     BP 124/83     Pulse Rate 66     Resp 18     Temp 97.8 F (36.6 C)     Temp Source Oral     SpO2 99 %     Weight      Height      Head Circumference      Peak Flow      Pain Score 10     Pain Loc      Pain Edu?      Excl. in GC?    No data found.  Updated Vital Signs BP 124/83 (BP Location: Left Arm)    Pulse 66    Temp 97.8 F (36.6 C) (Oral)    Resp  18    SpO2 99%   Visual Acuity Right Eye Distance:   Left Eye Distance:   Bilateral Distance:    Right Eye Near:   Left Eye Near:    Bilateral Near:     Physical Exam Constitutional:      Appearance: He is well-developed.  HENT:     Head: Normocephalic and atraumatic.  Eyes:     General: Lids are normal. Vision grossly intact.     Extraocular Movements: Extraocular movements intact.     Comments: Tearing noted from right; right temple pain with eye movement to the right; very mild injection to white of right eye  Cardiovascular:     Rate and Rhythm: Normal rate.  Pulmonary:     Effort: Pulmonary effort is normal.  Musculoskeletal:     Cervical back: Normal range of motion.  Skin:    General: Skin is warm and dry.  Neurological:     Mental Status: He is alert and oriented to person, place, and time. Mental status is at baseline.     Cranial Nerves: No cranial nerve deficit, dysarthria or facial  asymmetry.     Sensory: No sensory deficit.     Motor: No weakness.     Coordination: Coordination normal.  Psychiatric:        Mood and Affect: Mood normal.        Speech: Speech normal.      UC Treatments / Results  Labs (all labs ordered are listed, but only abnormal results are displayed) Labs Reviewed - No data to display  EKG   Radiology No results found.  Procedures Procedures (including critical care time)  Medications Ordered in UC Medications  ketorolac (TORADOL) injection 60 mg (60 mg Intramuscular Given 06/05/20 1126)  dexamethasone (DECADRON) injection 10 mg (10 mg Intramuscular Given 06/05/20 1127)    Initial Impression / Assessment and Plan / UC Course  I have reviewed the triage vital signs and the nursing notes.  Pertinent labs & imaging results that were available during my care of the patient were reviewed by me and considered in my medical decision making (see chart for details).     Significant right sided headache and now with eye tearing. Photophobia. No red flag findings on exam today, however patient endorses drooling and hand numbness at times. This is not a new headache for him but more intense than usual. No neurological findings on exam today, suspect cluster headache as source of pain. Strict er precautions given however, as may need imaging to ensure no intracranial abnormality as source of symptoms. Patient verbalized understanding and agreeable to plan.  Ambulatory out of clinic without difficulty.    Final Clinical Impressions(s) / UC Diagnoses   Final diagnoses:  Chronic cluster headache, not intractable  Eye tearing, right     Discharge Instructions     I am hopeful the medications today help with your symptoms.  If you don't get any improvement in the next few hours I would like you to go to the ER.  If your symptoms worsen please go to the ER.  If you develop weakness, drooling, change in speech, facial weakness or drooping,  or otherwise worsening please go to the ER.     ED Prescriptions    Medication Sig Dispense Auth. Provider   bacitracin-neomycin-polymyxin-hydrocortisone (CORTISPORIN) 1 % ophthalmic ointment Place 1 application into the right eye 3 (three) times daily for 5 days. 3.5 g Georgetta Haber, NP     PDMP  not reviewed this encounter.   Georgetta Haber, NP 06/05/20 [phone]

## 2020-07-23 ENCOUNTER — Ambulatory Visit: Payer: BC Managed Care – PPO | Attending: Family

## 2020-07-23 DIAGNOSIS — Z23 Encounter for immunization: Secondary | ICD-10-CM

## 2020-10-13 ENCOUNTER — Ambulatory Visit (HOSPITAL_COMMUNITY)
Admission: EM | Admit: 2020-10-13 | Discharge: 2020-10-13 | Disposition: A | Discharge status: Home / Self Care | Payer: BC Managed Care – PPO | Attending: Family Medicine | Admitting: Family Medicine

## 2020-10-13 ENCOUNTER — Other Ambulatory Visit: Payer: Self-pay

## 2020-10-13 ENCOUNTER — Encounter (HOSPITAL_COMMUNITY): Payer: Self-pay

## 2020-10-13 DIAGNOSIS — Z23 Encounter for immunization: Secondary | ICD-10-CM

## 2020-10-13 DIAGNOSIS — T22211A Burn of second degree of right forearm, initial encounter: Secondary | ICD-10-CM

## 2020-10-13 MED ORDER — AMOXICILLIN-POT CLAVULANATE 875-125 MG PO TABS: 1.0000 | ORAL_TABLET | Freq: Two times a day (BID) | ORAL | 0 refills | Status: AC

## 2020-10-13 MED ORDER — TETANUS-DIPHTH-ACELL PERTUSSIS 5-2.5-18.5 LF-MCG/0.5 IM SUSY
0.5000 mL | PREFILLED_SYRINGE | Freq: Once | INTRAMUSCULAR | Status: AC
  Administered 2020-10-13: 0.5 mL via INTRAMUSCULAR

## 2020-10-13 MED ORDER — SILVER SULFADIAZINE 1 % EX CREA: TOPICAL_CREAM | Freq: Once | CUTANEOUS | Status: AC

## 2020-10-13 MED ORDER — HYDROCODONE-ACETAMINOPHEN 5-325 MG PO TABS
1.0000 | ORAL_TABLET | Freq: Four times a day (QID) | ORAL | 0 refills | Status: AC | PRN
Start: 2020-10-13 — End: ?

## 2020-10-13 MED ORDER — SILVER SULFADIAZINE 1 % EX CREA: 1.0000 "application " | TOPICAL_CREAM | Freq: Two times a day (BID) | CUTANEOUS | 0 refills | Status: AC

## 2020-10-13 MED ORDER — SILVER SULFADIAZINE 1 % EX CREA
TOPICAL_CREAM | CUTANEOUS | Status: AC
  Filled 2020-10-13: qty 85

## 2020-10-13 MED ORDER — TETANUS-DIPHTH-ACELL PERTUSSIS 5-2.5-18.5 LF-MCG/0.5 IM SUSY
PREFILLED_SYRINGE | INTRAMUSCULAR | Status: AC
  Filled 2020-10-13: qty 0.5

## 2020-10-13 NOTE — Discharge Instructions (Signed)
Meds ordered this encounter  Medications   Tdap (BOOSTRIX) injection 0.5 mL   silver sulfADIAZINE (SILVADENE) 1 % cream   amoxicillin-clavulanate (AUGMENTIN) 875-125 MG tablet    Sig: Take 1 tablet by mouth every 12 (twelve) hours.    Dispense:  14 tablet    Refill:  0   silver sulfADIAZINE (SILVADENE) 1 % cream    Sig: Apply 1 application topically 2 (two) times daily. With bandage changes.    Dispense:  50 g    Refill:  0   HYDROcodone-acetaminophen (NORCO/VICODIN) 5-325 MG tablet    Sig: Take 1 tablet by mouth every 6 (six) hours as needed for severe pain.    Dispense:  12 tablet    Refill:  0   Be aware, you have been prescribed pain medications that may cause drowsiness. While taking this medication, do not take any other medications containing acetaminophen (Tylenol). Do not combine with alcohol or other illicit drugs. Please do not drive, operate heavy machinery, or take part in activities that require making important decisions while on this medication as your judgement may be clouded.
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
## 2020-10-29 ENCOUNTER — Encounter (HOSPITAL_COMMUNITY): Payer: Self-pay

## 2020-10-29 ENCOUNTER — Other Ambulatory Visit: Payer: Self-pay

## 2020-10-29 ENCOUNTER — Ambulatory Visit (HOSPITAL_COMMUNITY)
Admission: EM | Admit: 2020-10-29 | Discharge: 2020-10-29 | Disposition: A | Discharge status: Home / Self Care | Payer: Self-pay | Attending: Student | Admitting: Student

## 2020-10-29 ENCOUNTER — Ambulatory Visit (INDEPENDENT_AMBULATORY_CARE_PROVIDER_SITE_OTHER): Payer: Self-pay

## 2020-10-29 DIAGNOSIS — M542 Cervicalgia: Secondary | ICD-10-CM

## 2020-10-29 DIAGNOSIS — M25531 Pain in right wrist: Secondary | ICD-10-CM

## 2020-10-29 DIAGNOSIS — S6991XA Unspecified injury of right wrist, hand and finger(s), initial encounter: Secondary | ICD-10-CM

## 2020-10-29 DIAGNOSIS — G5601 Carpal tunnel syndrome, right upper limb: Secondary | ICD-10-CM

## 2020-10-29 DIAGNOSIS — S161XXA Strain of muscle, fascia and tendon at neck level, initial encounter: Secondary | ICD-10-CM

## 2020-10-29 MED ORDER — TIZANIDINE HCL 2 MG PO CAPS: 2.0000 mg | ORAL_CAPSULE | Freq: Three times a day (TID) | ORAL | 0 refills | Status: AC
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
## 2020-10-29 NOTE — ED Triage Notes (Signed)
Pt c/o neck and hand pain X 4 days. Pt states he is unable to movie his neck and his hand.

## 2020-10-29 NOTE — Discharge Instructions (Addendum)
-  Leave your wrist brace on at night for the next 2 weeks.  You can also wear this during the daytime for additional relief.  If you are still having symptoms in 10 to 14 days, follow-up with orthopedist; information below.  Call them and schedule an appointment.   -For your neck/muscular pain, try the muscle relaxer-Zanaflex (tizanidine), 3 times daily as needed for muscular pain.  This can make you drowsy, so take at night or when you do not have to go anywhere. -You can also take Tylenol 1000 mg 3 times daily, and ibuprofen 800 mg 3 times daily with food. -You can try a heating pad, gentle range of motion exercises.

## 2020-11-05 ENCOUNTER — Other Ambulatory Visit: Payer: Self-pay

## 2020-11-05 ENCOUNTER — Ambulatory Visit (INDEPENDENT_AMBULATORY_CARE_PROVIDER_SITE_OTHER): Payer: BC Managed Care – PPO | Admitting: Family Medicine

## 2020-11-05 VITALS — BP 108/70 | Ht 66.0 in | Wt 146.0 lb

## 2020-11-05 DIAGNOSIS — G5601 Carpal tunnel syndrome, right upper limb: Secondary | ICD-10-CM

## 2020-11-05 NOTE — Patient Instructions (Addendum)
Carpal tunnel syndrome: Your presentation and history are consistent with carpal tunnel syndrome.  The best first step for this issue is to wear a wrist splint at night.  Make sure that you are using your splint correctly with a hard metal part helping your wrist stay slightly extended.  Please come back to clinic in 1 month to make sure it is improving appropriately.  Using Motrin as needed for discomfort may also help with some inflammation.
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
## 2020-11-07 NOTE — Progress Notes (Signed)
[facility] Center Attending Note: I have seen and examined this patient. I have discussed this patient with the resident and reviewed the assessment and plan as documented above. I agree with the resident's findings and plan.  

## 2020-11-26 ENCOUNTER — Ambulatory Visit (INDEPENDENT_AMBULATORY_CARE_PROVIDER_SITE_OTHER): Payer: BC Managed Care – PPO | Admitting: Family Medicine

## 2020-11-26 ENCOUNTER — Other Ambulatory Visit: Payer: Self-pay

## 2020-11-26 VITALS — BP 110/80 | Ht 66.0 in | Wt 146.0 lb

## 2020-11-26 DIAGNOSIS — G5601 Carpal tunnel syndrome, right upper limb: Secondary | ICD-10-CM

## 2020-11-26 MED ORDER — METHYLPREDNISOLONE ACETATE 40 MG/ML IJ SUSP
40.0000 mg | Freq: Once | INTRAMUSCULAR | Status: AC
  Administered 2020-11-26: 40 mg via INTRA_ARTICULAR

## 2020-11-26 NOTE — Patient Instructions (Signed)
F/u as needed. AVS declined.

## 2020-11-26 NOTE — Progress Notes (Signed)
PCP: Patient, No Pcp Per (Inactive)  Subjective:   HPI: Patient is a 55 y.o. male here for follow-up for carpal tunnel syndrome.  Carpal tunnel syndrome Mr. Concannon was last seen in clinic 3 weeks ago.  At that time, he was shown how to correctly use a wrist splint and encouraged to wear his wrist splint every night.  He has been using his wrist splint appropriately for the past 3 weeks and has noted some improvement in the pain of his wrist but he continues to experience significant numbness in his thumb, pointer and middle finger of the right hand.  He also notices that he occasionally has some stiffness of his thumb, index finger and middle finger in a flexed position.  He is able to move these fingers with his other hand but can happen again throughout the day.  He does not notice any popping or clicking when he straightens out his fingers.  He was hoping for additional intervention to help with symptom relief.  Review of Systems:  Per HPI.   PMFSH, medications and smoking status reviewed.      Objective:  Physical Exam:  No flowsheet data found.   Gen: awake, alert, NAD, comfortable in exam room Pulm: breathing unlabored  Hand: Inspection: No obvious deformity. No swelling, erythema or bruising well-healing burn injury noted proximal to the wrist on the volar surface. Palpation: no TTP ROM: Full ROM of the digits and wrist. Fully able to extend and flex finger. Strength: 5/5 strength in the forearm, wrist Neurovascular: NV intact Special tests: Positive Tinel test, positive Phalen's test.  No palpable nodules of the hand.  No locking of the fingers.  Procedure performed:  carpal tunnel injection, ultrasound-guided  Consent obtained and verified. Time-out conducted. Noted no overlying erythema, induration, or other signs of local infection. The median nerve was identified with ultrasound.  The Doppler setting was used to avoid damage to vasculature.  The overlying skin was  prepped in a sterile fashion. Topical analgesic spray Needle:1.5 inch Completed without difficulty. Meds: 1cc lido, 1cc methylprednisolone The right distal wrist was cleaned in sterile fashion betadine and alcohol pad. 1cc of 40 mg of methylprednisolone and 1 cc 1% Lidocaine was injected using a ulnar in plane approach using ultrasound as noted above. Injection performed using a 5 cc syringe and 25 gauge 1 and 1/2 in needle. No complications were encountered. Minimal blood loss. A band aid was applied.     Assessment & Plan:  1.  Carpal tunnel syndrome He reports mild improvement of his pain although his numbness and some finger stiffness still significantly bother him.  He was interested in moving forward with a steroid injection today.  We discussed the risks and benefits of a steroid injection.  Ultimately, a steroid injection was successfully performed with ultrasound guidance.  He was told that he does not need additional follow-up with his symptoms entirely improved.  If he does not experience significant improvement in the next 4 weeks, he is to return to clinic for another possible trial of steroid injection or consideration of surgical release.  Mirian Mo, MD [facility], PGY-3  11/26/2020 10:24 AM   Resident Attestation   I saw and evaluated the patient, performing the key elements of the service.I  personally performed or re-performed the history, physical exam, and medical decision making activities of this service and have verified that the service and findings are accurately documented in the resident's note. I developed the management plan that  is described in the resident's note, and I agree with the content, with my edits above.  Jules Schick, DO [facility], PGY-4  I was a preceptor for this visit and available for immediate consultation to both the sports medicine fellow and the resident. Marsa Aris, DO

## 2020-11-26 NOTE — Progress Notes (Signed)
   Covid-19 Vaccination Clinic  Name:  John Kerr    MRN: [id number] DOB: 08/16/1965  11/26/2020  John Kerr was observed post Covid-19 immunization for 15 minutes without incident. He was provided with Vaccine Information Sheet and instruction to access the V-Safe system.   John Kerr was instructed to call 911 with any severe reactions post vaccine: Marland Kitchen Difficulty breathing  . Swelling of face and throat  . A fast heartbeat  . A bad rash all over body  . Dizziness and weakness   Immunizations Administered    Name Date Dose VIS Date Route   Moderna Covid-19 Booster Vaccine 07/23/2020 10:45 AM 0.25 mL 05/26/2020 Intramuscular   Manufacturer: Moderna   Lot: [id number]   NDC: 35670-141-03

## 2020-12-22 ENCOUNTER — Ambulatory Visit (INDEPENDENT_AMBULATORY_CARE_PROVIDER_SITE_OTHER): Payer: BC Managed Care – PPO | Admitting: Family Medicine

## 2020-12-22 ENCOUNTER — Other Ambulatory Visit: Payer: Self-pay

## 2020-12-22 VITALS — BP 110/72 | Ht 66.0 in | Wt 146.0 lb

## 2020-12-22 DIAGNOSIS — G5600 Carpal tunnel syndrome, unspecified upper limb: Secondary | ICD-10-CM | POA: Insufficient documentation

## 2020-12-22 DIAGNOSIS — G5601 Carpal tunnel syndrome, right upper limb: Secondary | ICD-10-CM
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
## 2020-12-22 NOTE — Assessment & Plan Note (Signed)
Suspected carpal tunnel not responding to conservative measures and failed ultrasound guided corticosteroid injection - EMG/nerve conduction study, I will call him with results if normal and if abnormal have him return at no charge to discuss next best steps.

## 2020-12-22 NOTE — Patient Instructions (Addendum)
It was great to see you today! Thank you for letting me participate in your care!  Today, we discussed your continued pain with numbness and tingling in your right wrist. Because you did not get much relief and you have continued symptoms so soon after the injection I am getting a nerve conduction study to see what next best steps to take. I will see you after the study to discuss results.  [facility] [address] [place], [place] [address] Phone: [phone]  They will call you to schedule an appt.  Be well, Jules Schick, DO PGY-4, Sports Medicine Fellow [facility]

## 2020-12-23 ENCOUNTER — Encounter: Payer: Self-pay | Admitting: Family Medicine

## 2021-01-11 ENCOUNTER — Encounter: Payer: Self-pay | Admitting: Family Medicine

## 2021-02-02 ENCOUNTER — Ambulatory Visit (INDEPENDENT_AMBULATORY_CARE_PROVIDER_SITE_OTHER): Payer: BC Managed Care – PPO | Admitting: Family Medicine

## 2021-02-02 ENCOUNTER — Other Ambulatory Visit: Payer: Self-pay

## 2021-02-02 VITALS — Ht 66.0 in | Wt 140.0 lb

## 2021-02-02 DIAGNOSIS — G5601 Carpal tunnel syndrome, right upper limb: Secondary | ICD-10-CM

## 2021-02-02 MED ORDER — GABAPENTIN 300 MG PO CAPS: 300.0000 mg | ORAL_CAPSULE | Freq: Every day | ORAL | 2 refills | Status: AC

## 2021-02-02 NOTE — Assessment & Plan Note (Addendum)
Patient is having exam and symptoms are consistent with carpal tunnel syndrome.  His previous ultrasound did show borderline enlarged median nerve.  He has been refractory to conservative management and injection and has a normal EMG.  I will start him on gabapentin 300 mg at nighttime daily as needed and he will call us back in 2-3 weeks to let us know how he is doing. -If he is having a poor response to gabapentin we will refer him to neurology for further work-up and evaluation. -Could also consider repeating ultrasound to measure the median nerve and if this is enlarged could consider referral to Ortho for surgery however I do think they will be hesitant to do a surgery with a normal EMG.

## 2021-02-02 NOTE — Patient Instructions (Signed)
It was great to see you today! Thank you for letting me participate in your care!  Today, we discussed your continued pain in your right hand and wrist. It still seems most consistent with carpal tunnel syndrome. Please take Gabapentin as prescribed and don't operate any heavy machinery or drive after taking for the first few weeks. Please call us and let us know if you are not getting better. Otherwise, we will follow up with you in 4 weeks.  Be well, Jules Schick, DO PGY-4, Sports Medicine Fellow [facility]
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
## 2021-03-04 ENCOUNTER — Other Ambulatory Visit: Payer: Self-pay

## 2021-03-04 ENCOUNTER — Ambulatory Visit (INDEPENDENT_AMBULATORY_CARE_PROVIDER_SITE_OTHER): Payer: BC Managed Care – PPO | Admitting: Sports Medicine

## 2021-03-04 VITALS — BP 116/62 | Ht 66.0 in | Wt 140.0 lb

## 2021-03-04 DIAGNOSIS — G5601 Carpal tunnel syndrome, right upper limb: Secondary | ICD-10-CM
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
## 2021-03-04 NOTE — Patient Instructions (Addendum)
We are going to refer you to a hand specialist to evaluate your right hand pain.  Dr. Betha Loa [facility] [address] [place], [place] [address] Phone: [phone]  Appt: 03/16/21 @ 9:30 am, please arrive at 9:15 am
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
